# Patient Record
Sex: Female | Born: 1990 | Race: White | Hispanic: No | Marital: Single | Smoking: Never smoker
Health system: Southern US, Community
[De-identification: ages and names within clinical notes are randomized; demographics above are authoritative.]

## PROBLEM LIST (undated history)

## (undated) DIAGNOSIS — M797 Fibromyalgia: Secondary | ICD-10-CM

## (undated) DIAGNOSIS — Z87442 Personal history of urinary calculi: Secondary | ICD-10-CM

## (undated) DIAGNOSIS — R51 Headache: Secondary | ICD-10-CM

## (undated) DIAGNOSIS — I10 Essential (primary) hypertension: Secondary | ICD-10-CM

## (undated) DIAGNOSIS — F418 Other specified anxiety disorders: Secondary | ICD-10-CM

## (undated) DIAGNOSIS — R519 Headache, unspecified: Secondary | ICD-10-CM

## (undated) DIAGNOSIS — G43909 Migraine, unspecified, not intractable, without status migrainosus: Secondary | ICD-10-CM

## (undated) DIAGNOSIS — M1990 Unspecified osteoarthritis, unspecified site: Secondary | ICD-10-CM

## (undated) DIAGNOSIS — N390 Urinary tract infection, site not specified: Secondary | ICD-10-CM

## (undated) DIAGNOSIS — J45909 Unspecified asthma, uncomplicated: Secondary | ICD-10-CM

## (undated) HISTORY — DX: Unspecified asthma, uncomplicated: J45.909

## (undated) HISTORY — DX: Migraine, unspecified, not intractable, without status migrainosus: G43.909

## (undated) HISTORY — DX: Urinary tract infection, site not specified: N39.0

## (undated) HISTORY — DX: Other specified anxiety disorders: F41.8

## (undated) HISTORY — DX: Unspecified osteoarthritis, unspecified site: M19.90

## (undated) HISTORY — DX: Personal history of urinary calculi: Z87.442

## (undated) HISTORY — DX: Fibromyalgia: M79.7

## (undated) HISTORY — DX: Headache, unspecified: R51.9

## (undated) HISTORY — DX: Essential (primary) hypertension: I10

## (undated) HISTORY — DX: Headache: R51

---
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
## 2012-04-28 ENCOUNTER — Emergency Department: Payer: Self-pay | Admitting: Emergency Medicine

## 2012-04-28 LAB — CBC
MCV: 88 fL (ref 80–100)
Platelet: 280 10*3/uL (ref 150–440)

## 2012-04-28 LAB — DRUG SCREEN, URINE
Amphetamines, Ur Screen: POSITIVE (ref ?–1000)
Cannabinoid 50 Ng, Ur ~~LOC~~: NEGATIVE (ref ?–50)
MDMA (Ecstasy)Ur Screen: NEGATIVE (ref ?–500)
Methadone, Ur Screen: NEGATIVE (ref ?–300)
Opiate, Ur Screen: NEGATIVE (ref ?–300)
Phencyclidine (PCP) Ur S: NEGATIVE (ref ?–25)
Tricyclic, Ur Screen: NEGATIVE (ref ?–1000)

## 2012-04-28 LAB — COMPREHENSIVE METABOLIC PANEL
Alkaline Phosphatase: 72 U/L — ABNORMAL LOW (ref 82–169)
Anion Gap: 10 (ref 7–16)
Bilirubin,Total: 0.2 mg/dL (ref 0.2–1.0)
Calcium, Total: 8.6 mg/dL — ABNORMAL LOW (ref 9.0–10.7)
Chloride: 105 mmol/L (ref 98–107)
Co2: 25 mmol/L (ref 21–32)
Creatinine: 0.88 mg/dL (ref 0.60–1.30)
EGFR (Non-African Amer.): >60
Osmolality: 280 (ref 275–301)
SGPT (ALT): 23 U/L (ref 12–78)
Sodium: 140 mmol/L (ref 136–145)
Total Protein: 7.8 g/dL (ref 6.4–8.6)

## 2012-04-28 LAB — URINALYSIS, COMPLETE
Ketone: NEGATIVE
Nitrite: NEGATIVE
Ph: 5 (ref 4.5–8.0)
Protein: NEGATIVE
RBC,UR: 2 /HPF (ref 0–5)
WBC UR: 14 /HPF (ref 0–5)

## 2012-04-28 LAB — TSH: Thyroid Stimulating Horm: 0.87 u[IU]/mL

## 2012-04-28 LAB — SALICYLATE LEVEL: Salicylates, Serum: <1.7 mg/dL

## 2012-04-28 LAB — ETHANOL: Ethanol: <3 mg/dL

## 2012-05-22 ENCOUNTER — Emergency Department: Payer: Self-pay | Admitting: Emergency Medicine

## 2012-05-22 LAB — CBC
HGB: 13.4 g/dL (ref 12.0–16.0)
MCHC: 33.1 g/dL (ref 32.0–36.0)
Platelet: 260 10*3/uL (ref 150–440)
RBC: 4.56 10*6/uL (ref 3.80–5.20)
RDW: 12.9 % (ref 11.5–14.5)
WBC: 9.4 10*3/uL (ref 3.6–11.0)

## 2012-05-22 LAB — URINALYSIS, COMPLETE
Bilirubin,UR: NEGATIVE
Blood: NEGATIVE
Glucose,UR: NEGATIVE mg/dL (ref 0–75)
Protein: NEGATIVE
Specific Gravity: 1.011 (ref 1.003–1.030)
WBC UR: 1 /HPF (ref 0–5)

## 2012-05-22 LAB — BASIC METABOLIC PANEL
BUN: 15 mg/dL (ref 7–18)
Calcium, Total: 9.1 mg/dL (ref 8.5–10.1)
Creatinine: 1.02 mg/dL (ref 0.60–1.30)
EGFR (African American): >60
EGFR (Non-African Amer.): >60
Glucose: 83 mg/dL (ref 65–99)
Osmolality: 279 (ref 275–301)
Potassium: 4.5 mmol/L (ref 3.5–5.1)

## 2012-10-13 ENCOUNTER — Emergency Department: Payer: Self-pay | Admitting: Emergency Medicine

## 2012-10-14 ENCOUNTER — Emergency Department: Payer: Self-pay | Admitting: Emergency Medicine

## 2012-10-14 LAB — CBC
HCT: 40.4 % (ref 35.0–47.0)
HGB: 13.6 g/dL (ref 12.0–16.0)
MCHC: 33.6 g/dL (ref 32.0–36.0)
MCV: 87 fL (ref 80–100)
RDW: 13.1 % (ref 11.5–14.5)
WBC: 6.8 10*3/uL (ref 3.6–11.0)

## 2012-10-14 LAB — URINALYSIS, COMPLETE
Bilirubin,UR: NEGATIVE
Blood: NEGATIVE
Glucose,UR: NEGATIVE mg/dL (ref 0–75)
Nitrite: NEGATIVE
RBC,UR: 1 /HPF (ref 0–5)
WBC UR: 4 /HPF (ref 0–5)

## 2012-10-14 LAB — COMPREHENSIVE METABOLIC PANEL
Albumin: 3.1 g/dL — ABNORMAL LOW (ref 3.4–5.0)
Anion Gap: 7 (ref 7–16)
BUN: 12 mg/dL (ref 7–18)
Bilirubin,Total: 0.4 mg/dL (ref 0.2–1.0)
Calcium, Total: 8.6 mg/dL (ref 8.5–10.1)
Co2: 24 mmol/L (ref 21–32)
Creatinine: 0.82 mg/dL (ref 0.60–1.30)
EGFR (African American): >60
EGFR (Non-African Amer.): >60
Osmolality: 278 (ref 275–301)
Potassium: 3.6 mmol/L (ref 3.5–5.1)
SGOT(AST): 69 U/L — ABNORMAL HIGH (ref 15–37)
SGPT (ALT): 88 U/L — ABNORMAL HIGH (ref 12–78)
Sodium: 139 mmol/L (ref 136–145)
Total Protein: 7.1 g/dL (ref 6.4–8.2)

## 2012-11-15 ENCOUNTER — Emergency Department: Payer: Self-pay | Admitting: Emergency Medicine

## 2012-12-15 ENCOUNTER — Emergency Department: Payer: Self-pay | Admitting: Emergency Medicine

## 2012-12-15 LAB — CBC
HCT: 39.2 % (ref 35.0–47.0)
HGB: 13.3 g/dL (ref 12.0–16.0)
MCH: 29.1 pg (ref 26.0–34.0)
MCHC: 33.9 g/dL (ref 32.0–36.0)
MCV: 86 fL (ref 80–100)
RBC: 4.56 10*6/uL (ref 3.80–5.20)
WBC: 9.8 10*3/uL (ref 3.6–11.0)

## 2012-12-15 LAB — URINALYSIS, COMPLETE
Bilirubin,UR: NEGATIVE
Blood: NEGATIVE
Glucose,UR: NEGATIVE mg/dL (ref 0–75)
Nitrite: NEGATIVE
Protein: NEGATIVE
RBC,UR: 2 /HPF (ref 0–5)
Specific Gravity: 1.02 (ref 1.003–1.030)
WBC UR: 7 /HPF (ref 0–5)

## 2012-12-15 LAB — BASIC METABOLIC PANEL
Anion Gap: 4 — ABNORMAL LOW (ref 7–16)
BUN: 9 mg/dL (ref 7–18)
Calcium, Total: 8.9 mg/dL (ref 8.5–10.1)
Co2: 29 mmol/L (ref 21–32)
Creatinine: 0.89 mg/dL (ref 0.60–1.30)
EGFR (African American): >60
EGFR (Non-African Amer.): >60
Glucose: 90 mg/dL (ref 65–99)
Osmolality: 278 (ref 275–301)
Sodium: 140 mmol/L (ref 136–145)

## 2013-02-19 ENCOUNTER — Emergency Department: Payer: Self-pay | Admitting: Emergency Medicine

## 2013-02-20 LAB — CBC
HCT: 38.5 % (ref 35.0–47.0)
HGB: 13 g/dL (ref 12.0–16.0)
MCH: 29.5 pg (ref 26.0–34.0)
MCHC: 33.7 g/dL (ref 32.0–36.0)
MCV: 88 fL (ref 80–100)
PLATELETS: 259 10*3/uL (ref 150–440)
RBC: 4.4 10*6/uL (ref 3.80–5.20)
RDW: 13.5 % (ref 11.5–14.5)
WBC: 11 10*3/uL (ref 3.6–11.0)

## 2013-02-20 LAB — COMPREHENSIVE METABOLIC PANEL
ALK PHOS: 63 U/L
ALT: 35 U/L (ref 12–78)
ANION GAP: 7 (ref 7–16)
Albumin: 3.1 g/dL — ABNORMAL LOW (ref 3.4–5.0)
BUN: 19 mg/dL — AB (ref 7–18)
Bilirubin,Total: 0.1 mg/dL — ABNORMAL LOW (ref 0.2–1.0)
CALCIUM: 8.7 mg/dL (ref 8.5–10.1)
CHLORIDE: 104 mmol/L (ref 98–107)
CO2: 28 mmol/L (ref 21–32)
Creatinine: 0.89 mg/dL (ref 0.60–1.30)
EGFR (African American): >60
EGFR (Non-African Amer.): >60
Glucose: 114 mg/dL — ABNORMAL HIGH (ref 65–99)
Osmolality: 281 (ref 275–301)
Potassium: 4 mmol/L (ref 3.5–5.1)
SGOT(AST): 20 U/L (ref 15–37)
Sodium: 139 mmol/L (ref 136–145)
TOTAL PROTEIN: 7.2 g/dL (ref 6.4–8.2)

## 2013-02-20 LAB — RAPID INFLUENZA A&B ANTIGENS

## 2013-03-01 ENCOUNTER — Emergency Department: Payer: Self-pay | Admitting: Emergency Medicine

## 2013-03-01 LAB — BASIC METABOLIC PANEL
ANION GAP: 9 (ref 7–16)
BUN: 12 mg/dL (ref 7–18)
Calcium, Total: 8.5 mg/dL (ref 8.5–10.1)
Chloride: 109 mmol/L — ABNORMAL HIGH (ref 98–107)
Co2: 17 mmol/L — ABNORMAL LOW (ref 21–32)
Creat: 1.1
Creatinine: 1.1 mg/dL (ref 0.60–1.30)
EGFR (African American): >60
GLUCOSE: 163 mg/dL — AB (ref 65–99)
Glucose: 163
OSMOLALITY: 273 (ref 275–301)
Potassium: 3.2 mmol/L
Potassium: 3.2 mmol/L — ABNORMAL LOW (ref 3.5–5.1)
SODIUM: 135 mmol/L — AB (ref 137–147)
Sodium: 135 mmol/L — ABNORMAL LOW (ref 136–145)

## 2013-03-01 LAB — CBC
HCT: 36.6 % (ref 35.0–47.0)
HEMOGLOBIN: 12.1 g/dL
HGB: 12.1 g/dL (ref 12.0–16.0)
MCH: 29.2 pg (ref 26.0–34.0)
MCHC: 33.1 g/dL (ref 32.0–36.0)
MCV: 88 fL (ref 80–100)
PLATELET COUNT: 176
Platelet: 176 10*3/uL (ref 150–440)
RBC: 4.15 10*6/uL (ref 3.80–5.20)
RDW: 13.4 % (ref 11.5–14.5)
WBC: 15.6
WBC: 15.6 10*3/uL — ABNORMAL HIGH (ref 3.6–11.0)

## 2013-03-01 LAB — TROPONIN I: Troponin-I: <0.02 ng/mL

## 2013-03-03 ENCOUNTER — Telehealth: Payer: Self-pay | Admitting: Family Medicine

## 2013-03-03 NOTE — Telephone Encounter (Signed)
Pt's mother called and is trying to est pt w/you as her PCP.  Pt is in college @ [employer] and has been sick for last several weeks.  She's been to [facility] ER twice over last 2 weeks and multiple walk in clinics.  Pt is getting multiple diagnoses such as streph throat and are treating her for that.  She's also been told viral pneumonia, bronchitis, etc.  Pt is getting worse instead of better and Mom feels she needs to be seen by and est w/a PCP.  Your new pt apptmt is not until March.  Pt has classes in the a/m and could come in any day after 11:30 a/m.  Can you accommodate a new pt apptmt prior to March? Thank you.

## 2013-03-03 NOTE — Telephone Encounter (Signed)
May place tomorrow at 4pm open slot.

## 2013-03-03 NOTE — Telephone Encounter (Signed)
Scheduled 03/04/2013

## 2013-03-04 ENCOUNTER — Other Ambulatory Visit: Payer: Self-pay | Admitting: Family Medicine

## 2013-03-04 ENCOUNTER — Encounter: Payer: Self-pay | Admitting: Family Medicine

## 2013-03-04 ENCOUNTER — Ambulatory Visit (INDEPENDENT_AMBULATORY_CARE_PROVIDER_SITE_OTHER): Payer: BC Managed Care – PPO | Admitting: Family Medicine

## 2013-03-04 VITALS — BP 148/100 | HR 110 | Temp 98.7°F | Ht 67.0 in | Wt 304.5 lb

## 2013-03-04 DIAGNOSIS — N390 Urinary tract infection, site not specified: Secondary | ICD-10-CM

## 2013-03-04 DIAGNOSIS — I10 Essential (primary) hypertension: Secondary | ICD-10-CM

## 2013-03-04 DIAGNOSIS — G43909 Migraine, unspecified, not intractable, without status migrainosus: Secondary | ICD-10-CM | POA: Insufficient documentation

## 2013-03-04 DIAGNOSIS — Z113 Encounter for screening for infections with a predominantly sexual mode of transmission: Secondary | ICD-10-CM

## 2013-03-04 DIAGNOSIS — J45909 Unspecified asthma, uncomplicated: Secondary | ICD-10-CM

## 2013-03-04 DIAGNOSIS — F341 Dysthymic disorder: Secondary | ICD-10-CM

## 2013-03-04 DIAGNOSIS — IMO0001 Reserved for inherently not codable concepts without codable children: Secondary | ICD-10-CM

## 2013-03-04 DIAGNOSIS — M1990 Unspecified osteoarthritis, unspecified site: Secondary | ICD-10-CM | POA: Insufficient documentation

## 2013-03-04 DIAGNOSIS — M129 Arthropathy, unspecified: Secondary | ICD-10-CM

## 2013-03-04 DIAGNOSIS — F418 Other specified anxiety disorders: Secondary | ICD-10-CM

## 2013-03-04 DIAGNOSIS — M797 Fibromyalgia: Secondary | ICD-10-CM

## 2013-03-04 DIAGNOSIS — E876 Hypokalemia: Secondary | ICD-10-CM

## 2013-03-04 DIAGNOSIS — J029 Acute pharyngitis, unspecified: Secondary | ICD-10-CM

## 2013-03-04 MED ORDER — AZITHROMYCIN 1 G PO PACK: 1.0000 g | PACK | Freq: Once | ORAL | Status: AC

## 2013-03-04 MED ORDER — AMOXICILLIN 875 MG PO TABS: 875.0000 mg | ORAL_TABLET | Freq: Two times a day (BID) | ORAL | Status: DC

## 2013-03-04 NOTE — Assessment & Plan Note (Signed)
If unrevealing workup for above, will recommend decrease in cymbalta dose.

## 2013-03-04 NOTE — Assessment & Plan Note (Signed)
-   Continue cymbalta 90 mg daily.  

## 2013-03-04 NOTE — Assessment & Plan Note (Signed)
Elevated today with tachycardia but pt acutely ill today - will continue to monitor Continue verapamil

## 2013-03-04 NOTE — Assessment & Plan Note (Signed)
Stable on prn albuterol.  

## 2013-03-04 NOTE — Assessment & Plan Note (Signed)
S/p recent steroid hip injection in [place]

## 2013-03-04 NOTE — Assessment & Plan Note (Signed)
Per patient, tested positive for strep pharyngitis at [facility], but not responding as expected to clindamycin (day 6). Facial swelling reaction to cephalosporin in the past, but pt states has never reacted to PCN and has taken this well in the past. Will prescribe amoxicillin 10 d course. Given high risk sexual activity and endorsed arthralgias, ?gonococcal pharyngitis - I have checked urine CT/GC today as well as sent throat culture with instructions to test for gonorrhea. Discussed suspected diagnosis and prevalence of highly resistant gonococcal bacteria - if fails this treatment, would recommend referral to allergist for possible sensitization or observation for IM CTX. Will also treat with 1 gram azithromycin. Pt agrees with plan.

## 2013-03-04 NOTE — Patient Instructions (Signed)
Stop clindamycin. Take azithromycin 1 gram slurry as well as start amoxicillin 875mg  twice daily for 10 days. Please call me with an update if not improving. We will call you with results of blood work and testing today.

## 2013-03-04 NOTE — Assessment & Plan Note (Signed)
Rarely takes percocets as abortive med.

## 2013-03-04 NOTE — Progress Notes (Signed)
Pre-visit discussion using our clinic review tool. No additional management support is needed unless otherwise documented below in the visit note.  

## 2013-03-04 NOTE — Progress Notes (Signed)
Subjective:    Patient ID: Tanya SouNicole Ray, female    DOB: 11/05/1990, 23 y.o.   MRN: [id number]  HPI CC: new pt to establish  Pt has felt ill for last 3.5 weeks.  Se[facility] with 3 separate diagnoses - viral pneumonia, bronchitis, then strep throat and thrush - currently on treatment for strep after [facility] (clindamycin on day 6/10 avoided PCN because of cephalosporin allergy but pt states has taken PCN well in past) and on treatment for thrush (diflucan 5d course started yesterday).  Feeling worse.  Has had several tests for flu.  Recent eval at [facility] 03/01/2013 - left without being treated 2/2 wait, but brings records: WBC 15.6, no diff, hgb 12.1, K 3.2, cr 1.1.  Started on tramadol for arm pain 1 week ago.  Endorses 3.5 wk h/o nausea/vomiting (NBNB), sore throat, neck pain, scalp pain, body aches, muscle aches.  + chills.  + cough mildly productive of mucous.  Mild dyspnea and chest discomfort.  Some diarrhea after clinda was started.  Started with congestion, body aches, headache.  Endorses shooting burning pain in L wrist for 1/2 hour at a time.  Some numbness of L hand fingertips.  Headaches staying about the same. So far has tried tylenol, advil, robitussin, mucinex, tramadol, dayquil, pseudophed.  No fevers, abd pain.  No shoulder pain.   Has had mono in the past.  Hip pain - recent cortisone shot into hip in [place].  On cymbalta 90mg  daily for depression/anxiety and fibromyalgia.  This was increased by psychiatrist in [place].  She is also on lorazepam 1mg  nightly and seroquel 200mg  nightly.  Prior on abilify. On percocet for migraines (takes about once a month).  Flu shot 2014  Currently sexually active - 15 partners in last year.  Birth control use 100%.  Protection 90% time.  Last HIV test and CT/GC test 09/2012.  No h/o STDs.  Medications and allergies reviewed and updated in chart.  Past histories reviewed and updated if relevant as  below. There are no active problems to display for this patient.  Past Medical History  Diagnosis Date  . Asthma   . Arthritis   . Depression with anxiety   . Frequent headaches     daily  . HTN (hypertension)   . History of kidney stones 2013  . Hx: UTI (urinary tract infection)     frequent - [place]rolog[place] in [place]  . Migraines     s/p botox injections [facility] [place] [facility] [place]  . Fibromyalgia    Past Surgical History  Procedure Laterality Date  . Appendectomy  2013  . Tonsillectomy  2003   History  Substance Use Topics  . Smoking status: Never Smoker   . Smokeless tobacco: Never Used  . Alcohol Use: No   Family History  Problem Relation Age of Onset  . Arthritis Father   . Arthritis Mother   . Cancer Paternal Grandmother     ovarian (as well as several other women on dad's side)  . Cancer Maternal Grandfather     lung (smoker)  . Cancer Maternal Uncle     ? mets to liver  . Hypertension Father   . Hyperlipidemia Mother   . CAD Neg Hx   . Stroke Neg Hx   . Diabetes Maternal Grandfather    Allergies  Allergen Reactions  . Cephalosporins Swelling    Facial swelling   No current outpatient prescriptions on file prior  to visit.   No current facility-administered medications on file prior to visit.     Review of Systems Per HPI    Objective:   Physical Exam  Nursing note and vitals reviewed. Constitutional: She appears well-developed and well-nourished. No distress.  HENT:  Head: Normocephalic and atraumatic.  Right Ear: Hearing, tympanic membrane, external ear and ear canal normal.  Left Ear: Hearing, tympanic membrane, external ear and ear canal normal.  Nose: No mucosal edema or rhinorrhea. Right sinus exhibits frontal sinus tenderness. Right sinus exhibits no maxillary sinus tenderness. Left sinus exhibits frontal sinus tenderness. Left sinus exhibits no maxillary sinus tenderness.  Mouth/Throat: Uvula is midline and mucous  membranes are normal. Posterior oropharyngeal edema and posterior oropharyngeal erythema present. No oropharyngeal exudate or tonsillar abscesses.  Nasal mucosal erythema  Eyes: Conjunctivae and EOM are normal. Pupils are equal, round, and reactive to light. No scleral icterus.  Neck: Normal range of motion. No thyromegaly present.  Tender with ROM at neck but neg kerdnig/bruzynski  Cardiovascular: Regular rhythm, normal heart sounds and intact distal pulses.  Tachycardia present.   No murmur heard. Pulmonary/Chest: Effort normal and breath sounds normal. No respiratory distress. She has no wheezes. She has no rales.  Abdominal: Soft. Bowel sounds are normal. She exhibits no distension and no mass. There is no hepatosplenomegaly. There is generalized tenderness (mild). There is no rigidity, no rebound, no guarding, no CVA tenderness and negative Murphy's sign.  obese  Musculoskeletal: She exhibits no edema.  Lymphadenopathy:    She has cervical adenopathy (R>L tender shotty LAD).  Skin: Skin is warm and dry. No rash noted.       Assessment & Plan:

## 2013-03-05 ENCOUNTER — Emergency Department: Payer: Self-pay | Admitting: Emergency Medicine

## 2013-03-05 ENCOUNTER — Telehealth: Payer: Self-pay | Admitting: Family Medicine

## 2013-03-05 LAB — CBC WITH DIFFERENTIAL/PLATELET
Basophils Absolute: 0 10*3/uL (ref 0.0–0.1)
Basophils Relative: 0.3 % (ref 0.0–3.0)
EOS PCT: 3 % (ref 0.0–5.0)
Eosinophils Absolute: 0.2 10*3/uL (ref 0.0–0.7)
HCT: 36.1 % (ref 36.0–46.0)
Hemoglobin: 12.1 g/dL (ref 12.0–15.0)
Lymphocytes Relative: 38.1 % (ref 12.0–46.0)
Lymphs Abs: 3.1 10*3/uL (ref 0.7–4.0)
MCHC: 33.5 g/dL (ref 30.0–36.0)
MCV: 86.3 fl (ref 78.0–100.0)
MONOS PCT: 7.5 % (ref 3.0–12.0)
Monocytes Absolute: 0.6 10*3/uL (ref 0.1–1.0)
NEUTROS PCT: 51.1 % (ref 43.0–77.0)
Neutro Abs: 4.2 10*3/uL (ref 1.4–7.7)
Platelets: 310 10*3/uL (ref 150.0–400.0)
RBC: 4.19 Mil/uL (ref 3.87–5.11)
RDW: 13.5 % (ref 11.5–14.6)
WBC: 8.3 10*3/uL (ref 4.5–10.5)

## 2013-03-05 LAB — GC/CHLAMYDIA PROBE AMP, URINE
Chlamydia, Swab/Urine, PCR: NEGATIVE
GC Probe Amp, Urine: NEGATIVE

## 2013-03-05 LAB — CBC
HCT: 35.4 % (ref 35.0–47.0)
HGB: 11.8 g/dL — AB (ref 12.0–16.0)
MCH: 28.7 pg (ref 26.0–34.0)
MCHC: 33.3 g/dL (ref 32.0–36.0)
MCV: 86 fL (ref 80–100)
PLATELETS: 295 10*3/uL (ref 150–440)
RBC: 4.11 10*6/uL (ref 3.80–5.20)
RDW: 13.4 % (ref 11.5–14.5)
WBC: 6.6 10*3/uL (ref 3.6–11.0)

## 2013-03-05 LAB — BASIC METABOLIC PANEL
ANION GAP: 5 — AB (ref 7–16)
BUN: 11 mg/dL (ref 6–23)
BUN: 10 mg/dL (ref 7–18)
CO2: 23 meq/L (ref 19–32)
CO2: 26 mmol/L (ref 21–32)
Calcium, Total: 8.6 mg/dL (ref 8.5–10.1)
Calcium: 9.3 mg/dL (ref 8.4–10.5)
Chloride: 106 mEq/L (ref 96–112)
Chloride: 110 mmol/L — ABNORMAL HIGH (ref 98–107)
Creatinine, Ser: 0.9 mg/dL (ref 0.4–1.2)
Creatinine: 0.74 mg/dL (ref 0.60–1.30)
EGFR (African American): >60
EGFR (Non-African Amer.): >60
GFR: 80.05 mL/min (ref >60.00)
GLUCOSE: 80 mg/dL (ref 70–99)
Glucose: 105 mg/dL — ABNORMAL HIGH (ref 65–99)
Osmolality: 281 (ref 275–301)
Potassium: 3.9 mEq/L (ref 3.5–5.1)
Potassium: 3.7 mmol/L (ref 3.5–5.1)
SODIUM: 140 meq/L (ref 135–145)
SODIUM: 141 mmol/L (ref 136–145)

## 2013-03-05 LAB — MONONUCLEOSIS SCREEN
MONO SCREEN: NEGATIVE
Mono Test: NEGATIVE

## 2013-03-05 NOTE — Telephone Encounter (Signed)
Spoke with patient. She said she hurts all over and her neck is so stiff that she can barely move it. She also is vomiting with chills and has a sore throat. She also says that she feels confused as though she can't "hold a thought in her head." She was actually walking into the ER when I was talking to her. She said she forgot to tell you that she tested positive for a staph infection, but hasn't taken the antibiotics for it yet and wasn't sure if that may have something to do with how she feels or not, but she wanted you to know.

## 2013-03-05 NOTE — Telephone Encounter (Signed)
Pt called back to see why no one has called her about this issue. I explained to her that you are seeing patients all day and when you see the note she will shortly after get a phone call from your assistant. I told her that if she really needed to be admitted to the hospital then she can either go to [facility] or [facility]. Pt understood.

## 2013-03-05 NOTE — Telephone Encounter (Signed)
Yes. That's where she went. I'll call Monday.

## 2013-03-05 NOTE — Telephone Encounter (Signed)
plz call for update on how she's feeling and more information on what's worse today. Blood work results not back yet. If fever or worsening neck stiffness or worsening pain would suggest ER evaluation.

## 2013-03-05 NOTE — Telephone Encounter (Signed)
Noted. I assume she went to [facility] ER because I don't see records of this. Can we call on Monday for an update on how she's feeling?

## 2013-03-05 NOTE — Telephone Encounter (Signed)
Pt request to be admitted into the hospital because she feels horrible today. Pt states she had cortisone shots a month ago and got sick shortly after and maybe a bacterial infection. Pt is really concerned b/c she has been really sick for 3 weeks. Please advise. Pt request c/b.

## 2013-03-06 LAB — CULTURE, GROUP A STREP

## 2013-03-09 ENCOUNTER — Telehealth: Payer: Self-pay | Admitting: Family Medicine

## 2013-03-09 DIAGNOSIS — J029 Acute pharyngitis, unspecified: Secondary | ICD-10-CM

## 2013-03-09 MED ORDER — AMOXICILLIN 875 MG PO TABS: 875.0000 mg | ORAL_TABLET | Freq: Two times a day (BID) | ORAL | Status: AC

## 2013-03-09 NOTE — Telephone Encounter (Addendum)
Have sent in amoxicillin - have sent in enough for 7 days.  Would recommend she finish levaquin for pneumonia however - as long as she can tolerate this. If vomiting persists let us know.

## 2013-03-09 NOTE — Telephone Encounter (Signed)
plz call and clarify - does she have script for levaquin prescribed at the ER?  If so I would fill this to take for pneumonia and not continue amoxicillin. Also can we get records from latest ER visit with CXR?

## 2013-03-09 NOTE — Telephone Encounter (Signed)
Spoke with patient. She wanted Rx for amoxicillin for the strep because she was vomiting. (See result note) Records requested.

## 2013-03-09 NOTE — Telephone Encounter (Signed)
Pt called.  OV 03/04/13.  She lost the original RX for her abx and wants to have it filled in [place].  Please advise.

## 2013-03-10 NOTE — Telephone Encounter (Signed)
Patient notified and has had no vomiting today but hasn't had any food either. I advised more fluids than food and when reintroducing food-to eat bland diet to help reset gut. She verbalized understanding.

## 2013-03-11 LAB — GONOCOCCUS CULTURE: Organism ID, Bacteria: NO GROWTH

## 2013-03-17 ENCOUNTER — Encounter: Payer: Self-pay | Admitting: *Deleted

## 2013-03-17 ENCOUNTER — Telehealth: Payer: Self-pay | Admitting: Family Medicine

## 2013-03-17 NOTE — Telephone Encounter (Signed)
Relevant patient education mailed to patient.  

## 2013-04-07 NOTE — Telephone Encounter (Signed)
ADDENDUM ==> reviewed [facility] ER records Dx with RUL PNA WBC 6.6, Hgb 11.8, plt 295, Cr 0.73 Mono test negative CXR - patchy infiltrate anterior segment RUL

## 2014-08-29 IMAGING — CT CT HEAD WITHOUT CONTRAST
2 series · 16 of 30 positions shown, 20 images · non-contrast
Comparison: none

REASON FOR EXAM: headache for 3 days without relief of regular medication
    flex 52
COMMENTS:   LMP: pending HCG

[Series 2: without · axial · non-contrast · 0.42mm/px · z∈[+558,+678]mm · 13 of 29 slices shown, 17 images]
[im 3/29  brain]
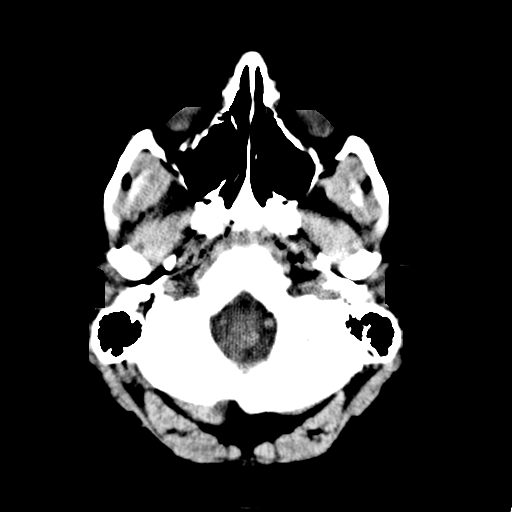
[im 3/29  bone]
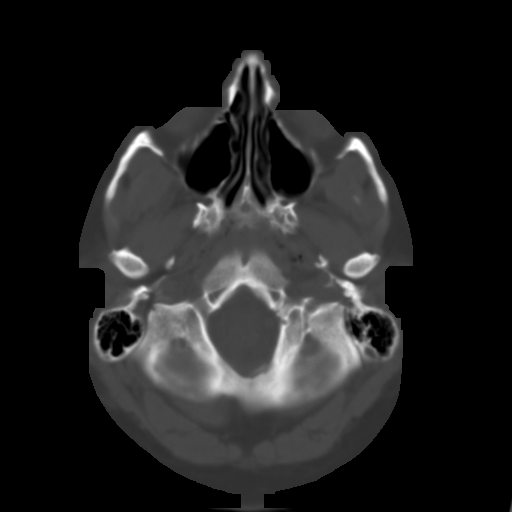
[im 5/29  brain]
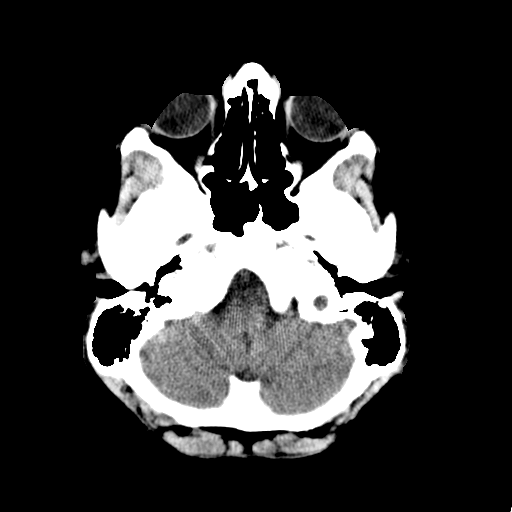
[im 7/29  brain]
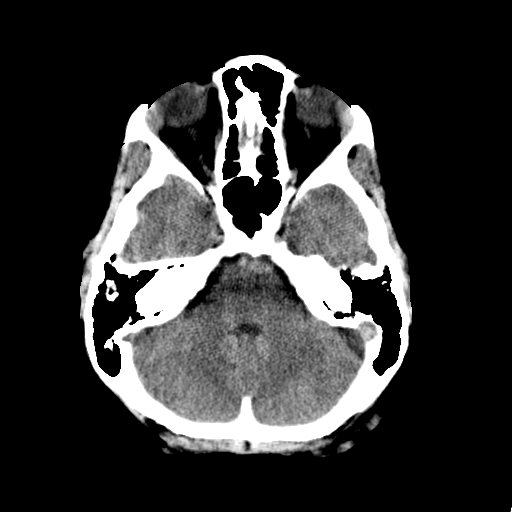
[im 9/29  brain]
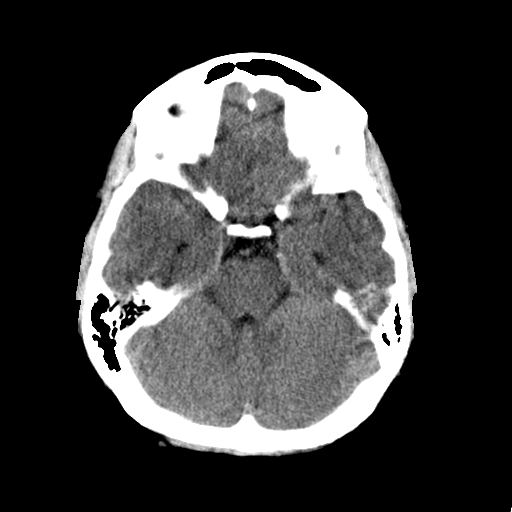
[im 11/29  brain]
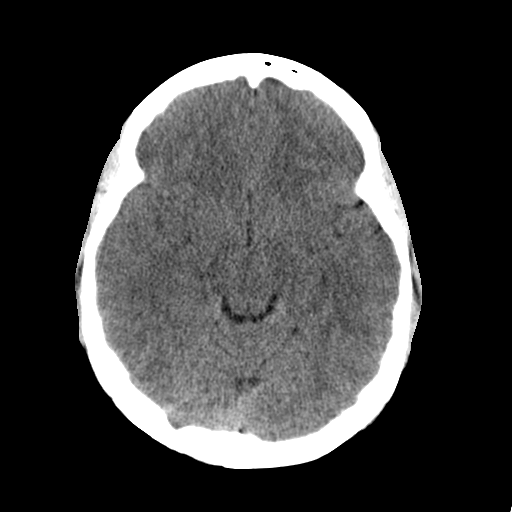
[im 11/29  bone]
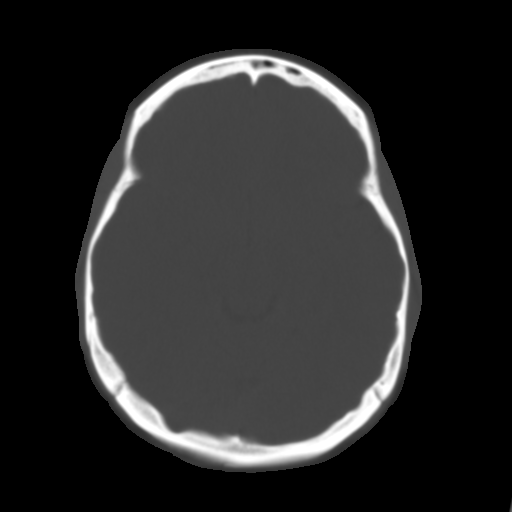
[im 13/29  brain]
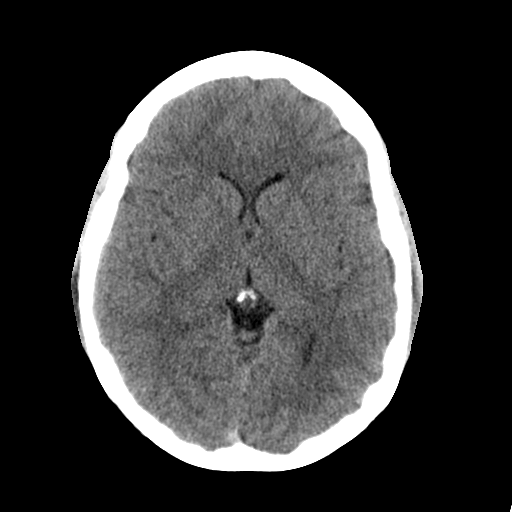
[im 15/29  brain]
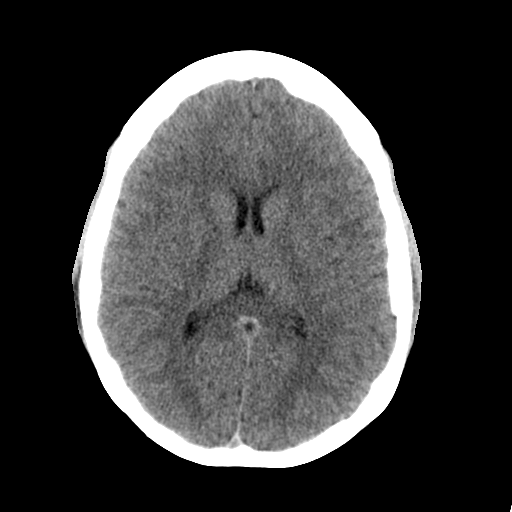
[im 17/29  brain]
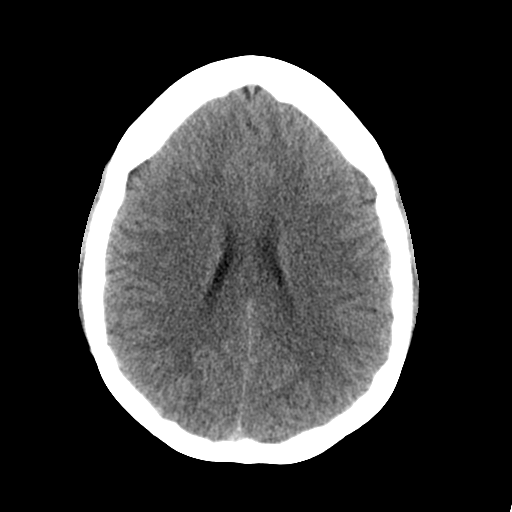
[im 19/29  brain]
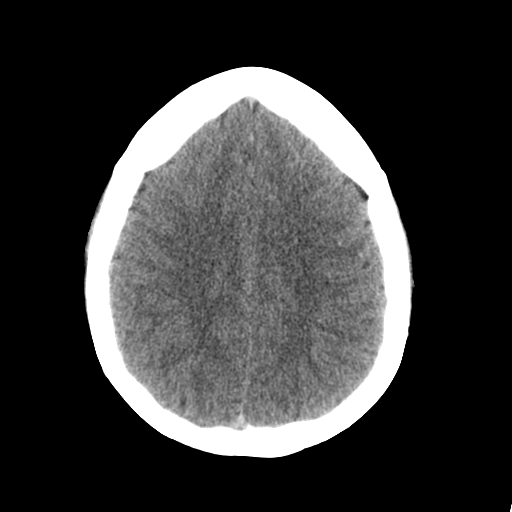
[im 19/29  bone]
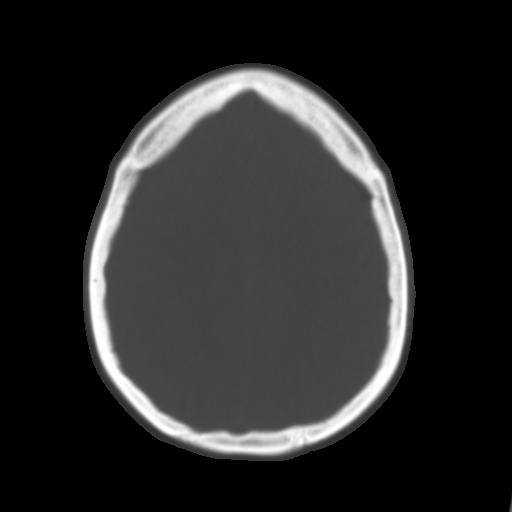
[im 21/29  brain]
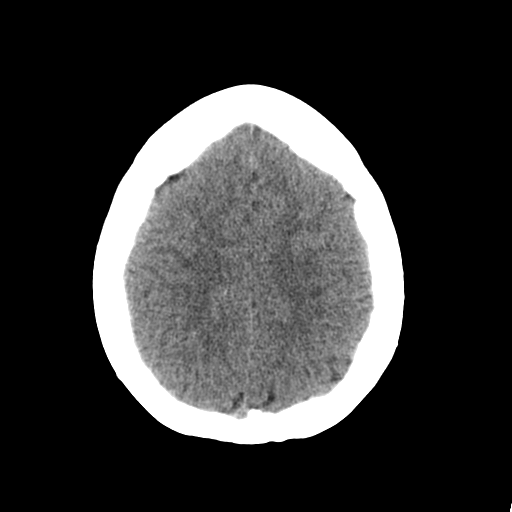
[im 23/29  brain]
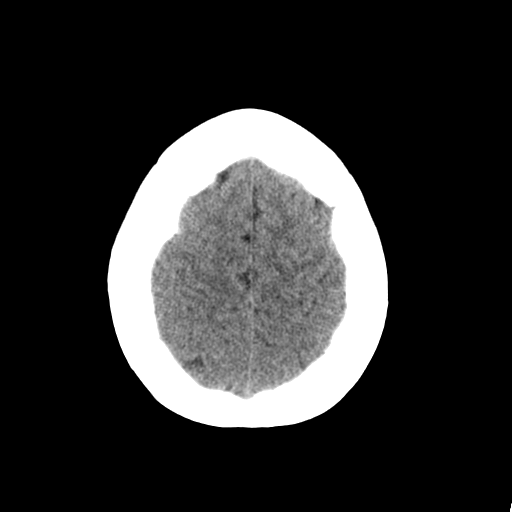
[im 25/29  brain]
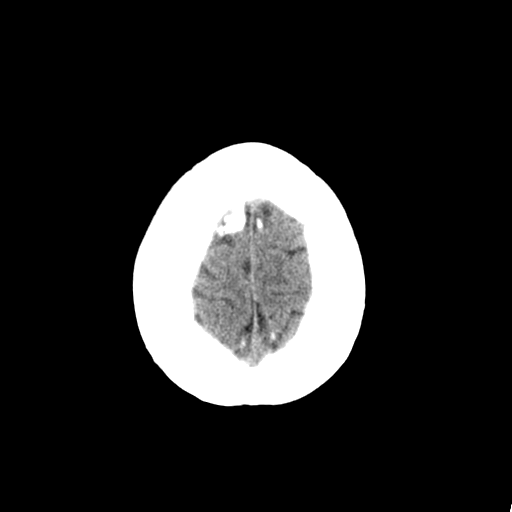
[im 27/29  brain]
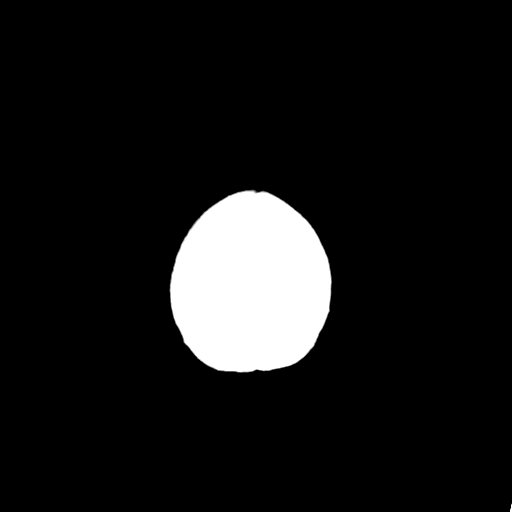
[im 27/29  bone]
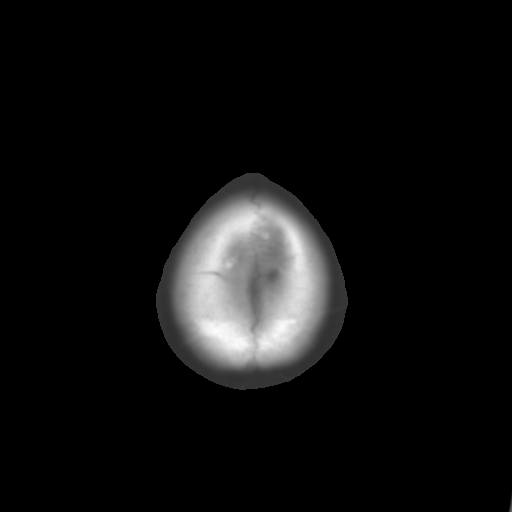

[Series 3: bone · axial · 0.42mm/px · z∈[+558,+598]mm · 3 of 29 slices shown]
[im 3/29  bone]
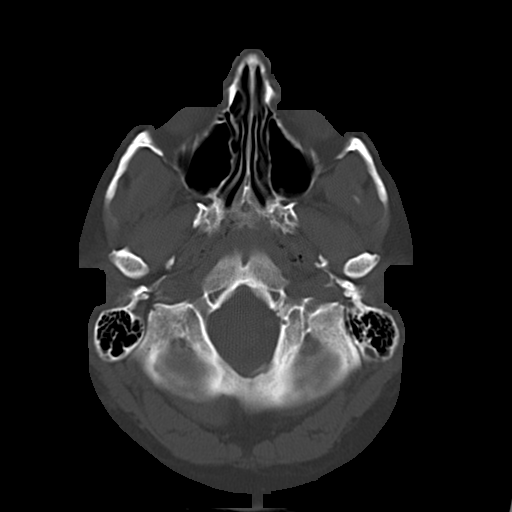
[im 7/29  bone]
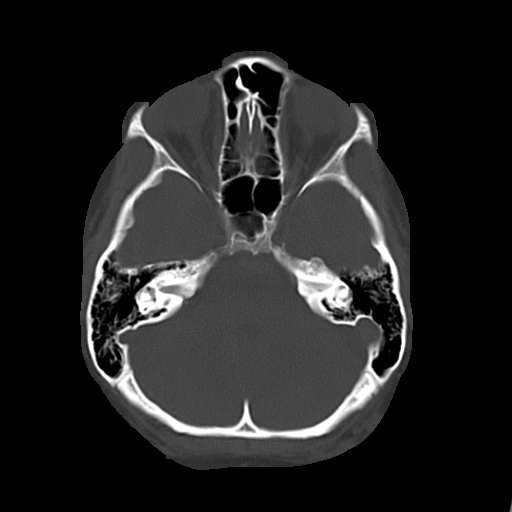
[im 11/29  bone]
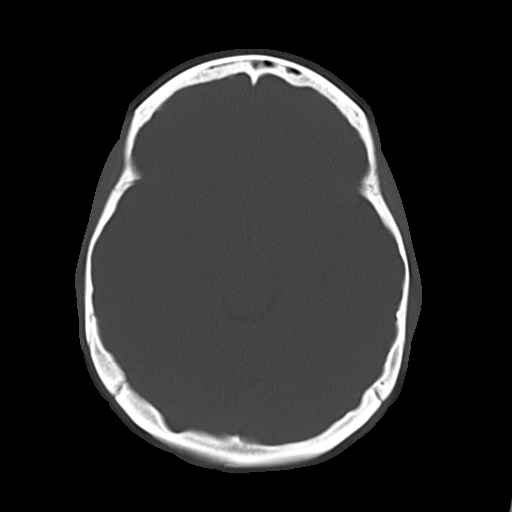

[16 of 30 positions shown; findings below may reference images not displayed]

PROCEDURE:     CT  - CT HEAD WITHOUT CONTRAST  - October 14, 2012  [DATE]

RESULT:     History: Headache.

Comparison Study: No prior.

Fines: Standard nonenhanced CT obtained. No mass. No hydrocephalus. No
hemorrhage. Orbits are unremarkable. No acute bony abnormality. Mucous
retention cyst left maxillary sinus.
IMPRESSION: No acute abnormality. Mucous retention cyst left maxillary
sinus.

## 2015-01-04 IMAGING — CR DG CHEST 2V
1 series · 2 of 2 positions shown · non-contrast
Comparison: None.

CLINICAL DATA: Bronchitis.  Tachycardia.

EXAM:
CHEST  2 VIEW

[Series 1: w chest pa · 0.14mm/px · 2 of 2 slices shown]
[im 1/2]
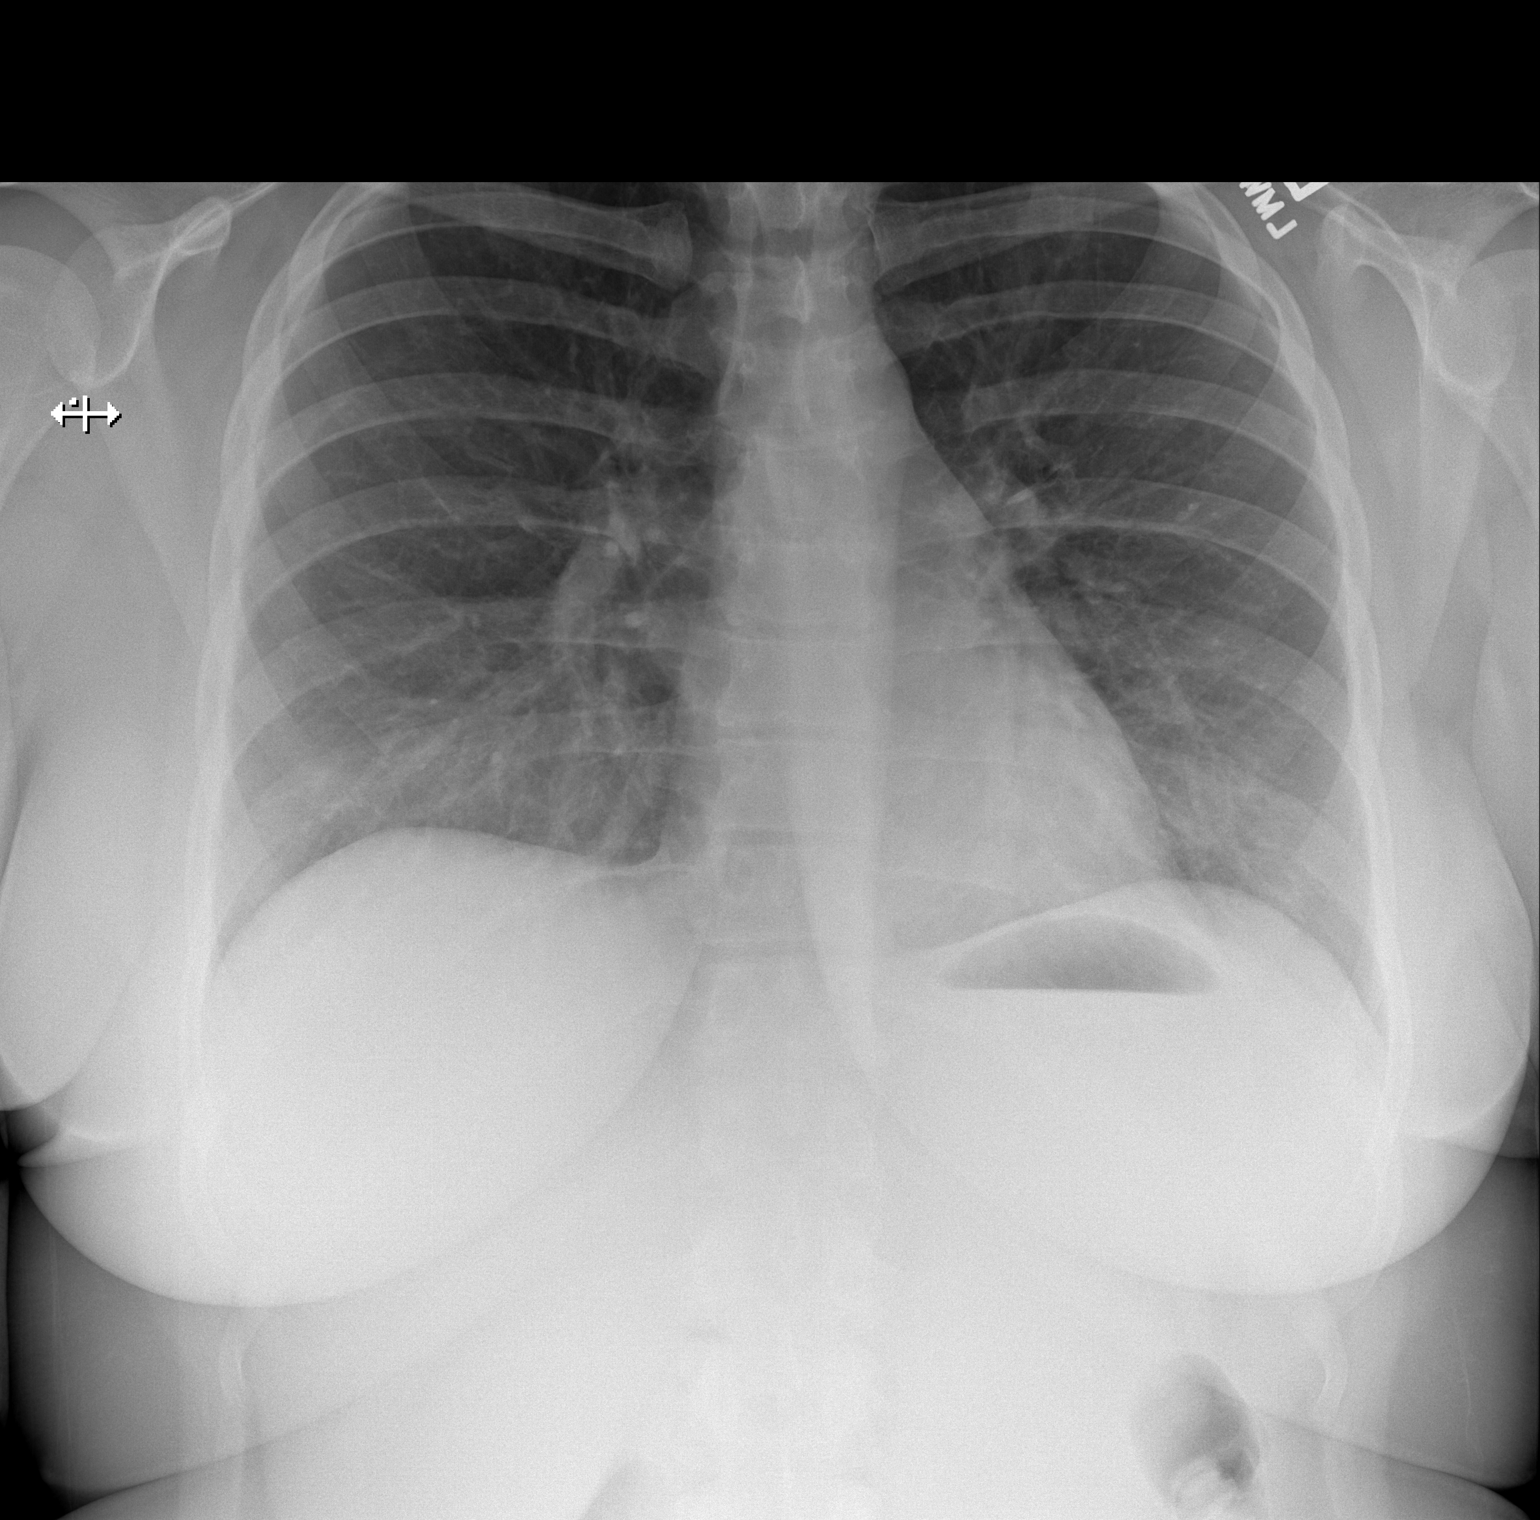
[im 2/2]
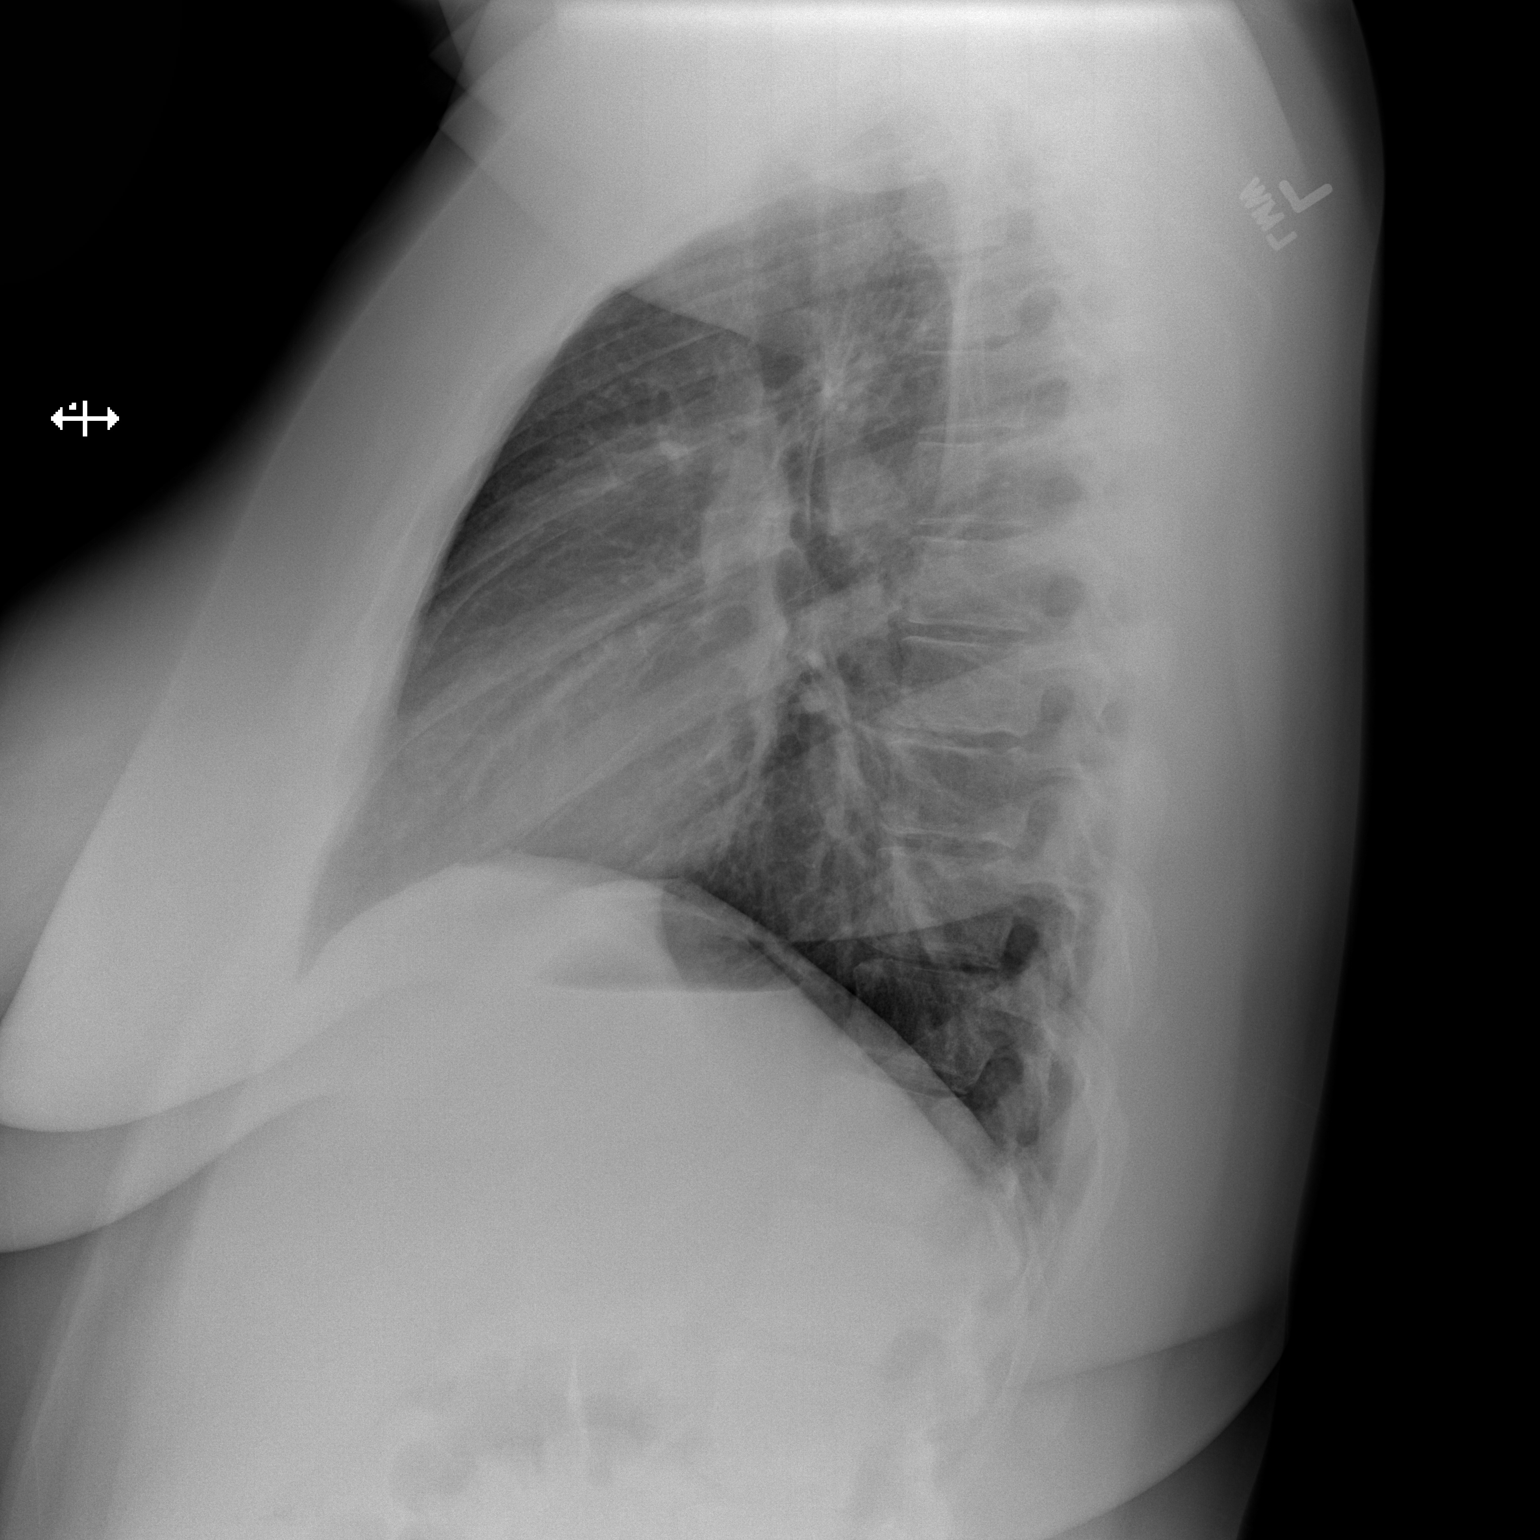

[2 of 2 positions shown; findings below may reference images not displayed]

FINDINGS: Heart size and mediastinal contours are within normal limits. Both
lungs are clear. Visualized skeletal structures are unremarkable.
IMPRESSION: Negative exam.

## 2015-01-18 IMAGING — CR DG CHEST 2V
1 series · 3 of 3 positions shown · non-contrast
Comparison: February 19, 2013

CLINICAL DATA: Cough and fever

EXAM:
CHEST  2 VIEW

[Series 1: pa · 0.17mm/px · 3 of 3 slices shown]
[im 1/3]
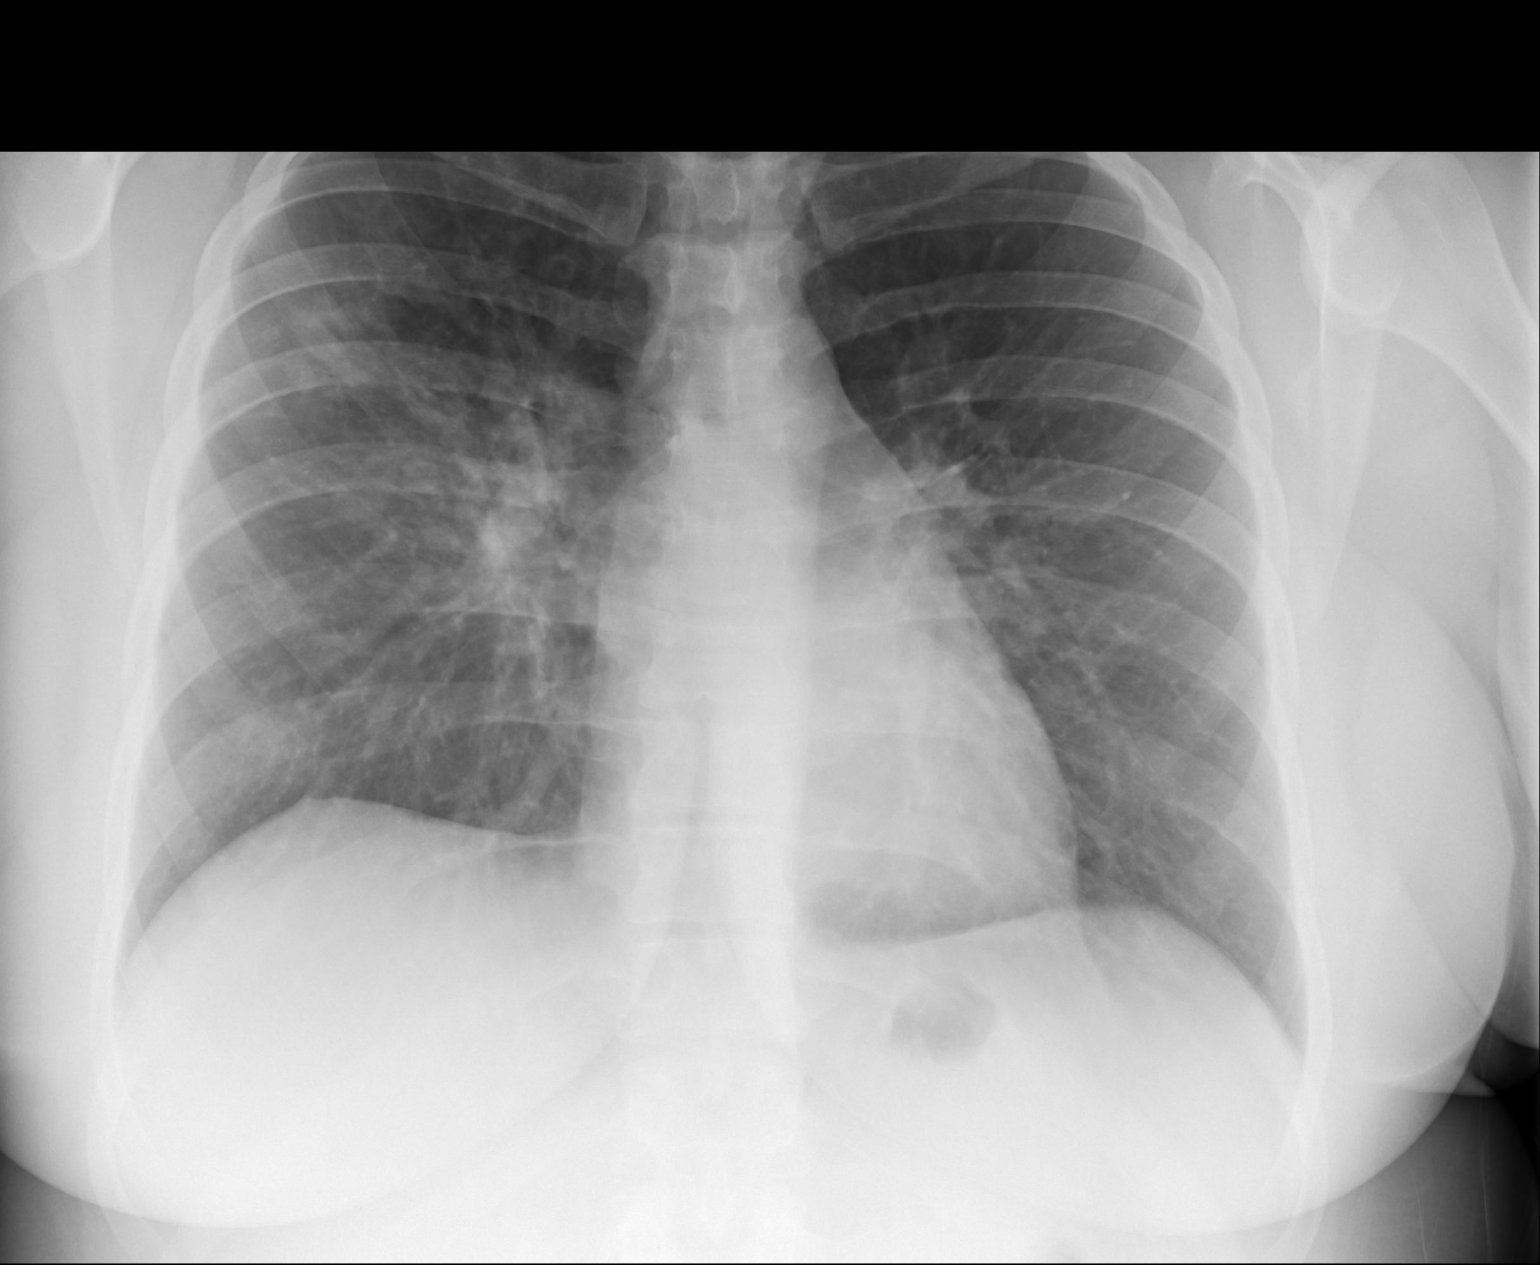
[im 2/3]
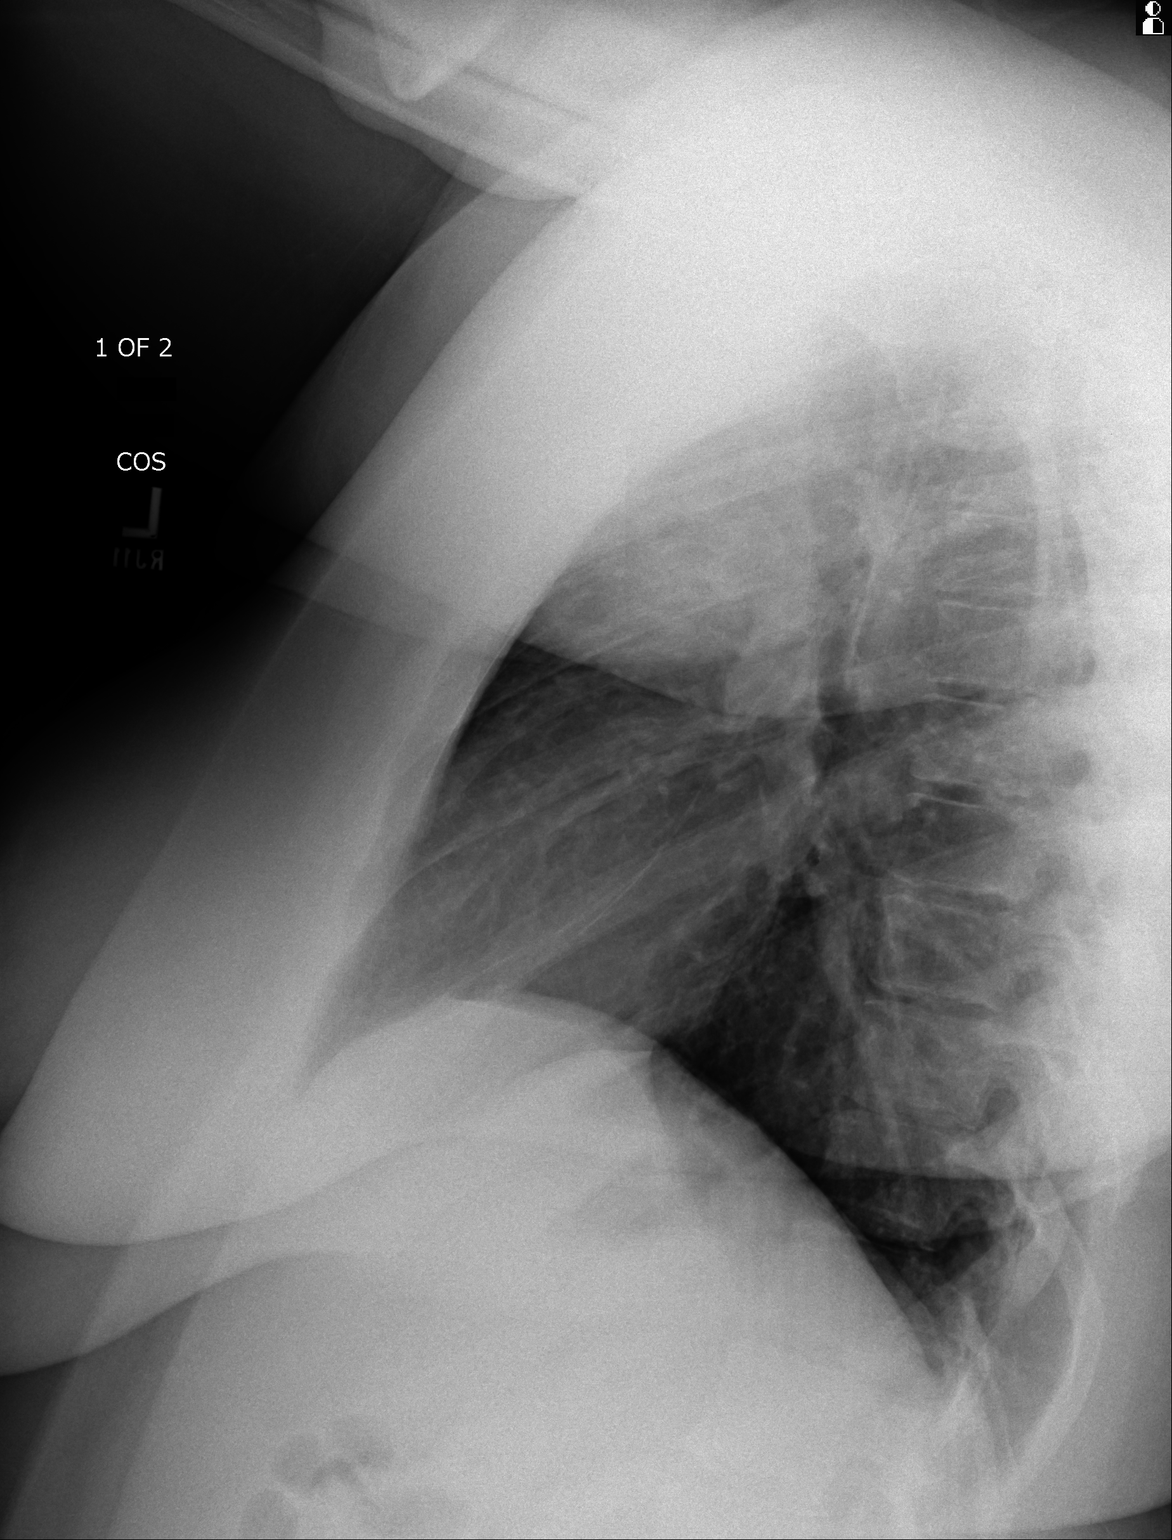
[im 3/3]
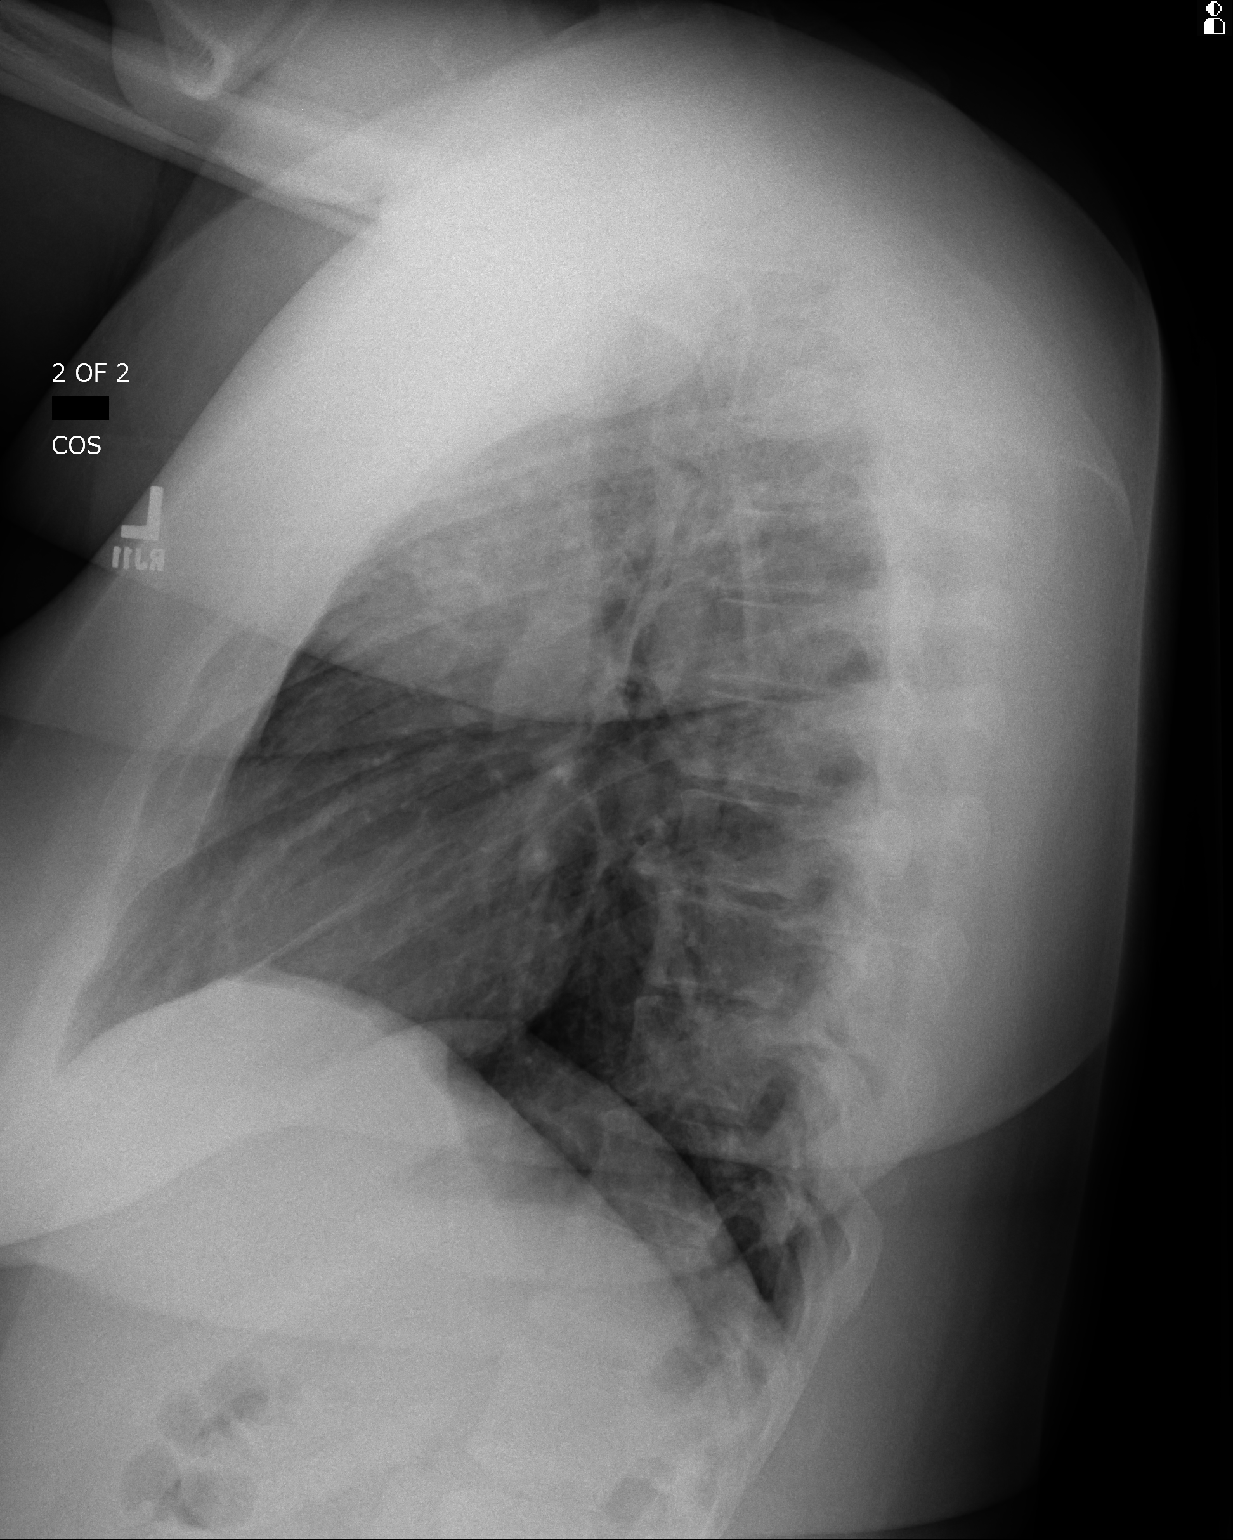

[3 of 3 positions shown; findings below may reference images not displayed]

FINDINGS: There is patchy infiltrate in the anterior segment of the right
upper lobe. Lungs are otherwise clear. Heart size and pulmonary
vascularity are normal. No adenopathy. No bone lesions.
IMPRESSION: Patchy infiltrate anterior segment right upper lobe.
